# Patient Record
Sex: Female | Born: 1970 | Race: White | Hispanic: No | Marital: Married
Health system: Southern US, Community
[De-identification: ages and names within clinical notes are randomized; demographics above are authoritative.]

---

## 2005-07-26 ENCOUNTER — Ambulatory Visit: Payer: Self-pay | Admitting: Obstetrics and Gynecology

## 2005-07-28 ENCOUNTER — Ambulatory Visit: Payer: Self-pay | Admitting: Obstetrics and Gynecology

## 2006-01-30 ENCOUNTER — Ambulatory Visit: Payer: Self-pay | Admitting: Obstetrics and Gynecology

## 2007-09-07 IMAGING — US ULTRASOUND RIGHT BREAST
1 series · 16 of 16 positions shown · non-contrast
Comparison: none

REASON FOR EXAM: Density  right breast  Poss US
COMMENTS:

[Series 1: ultrasound right breast · 16 of 16 slices shown]
[im 1/16]
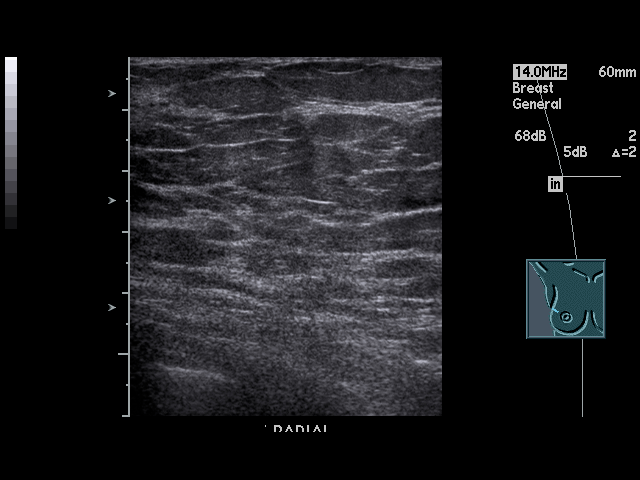
[im 2/16]
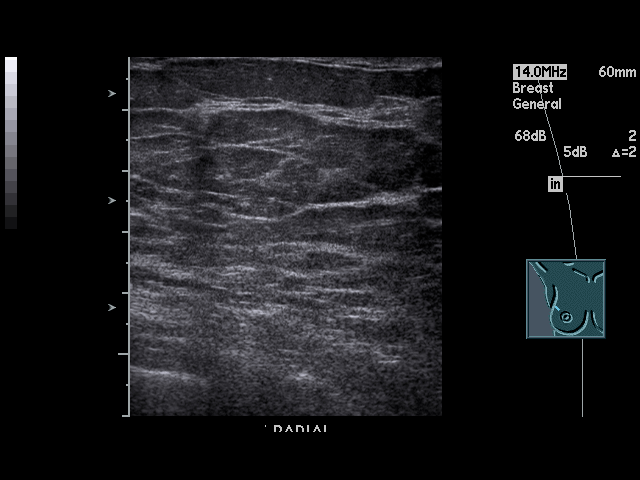
[im 3/16]
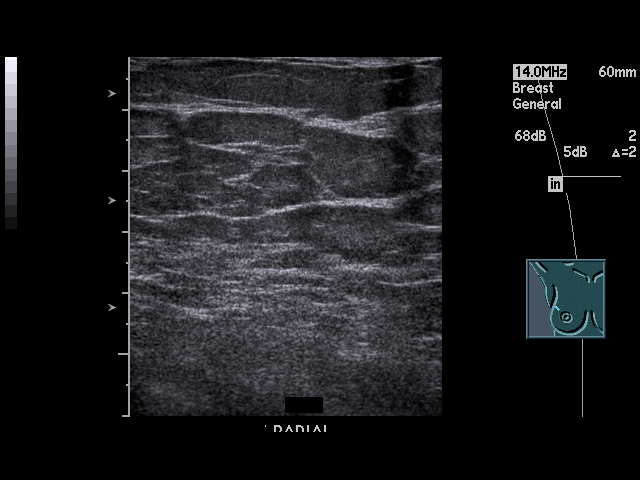
[im 4/16]
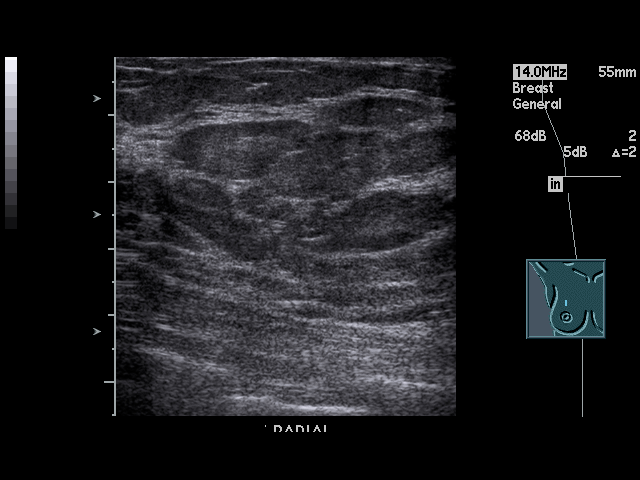
[im 5/16]
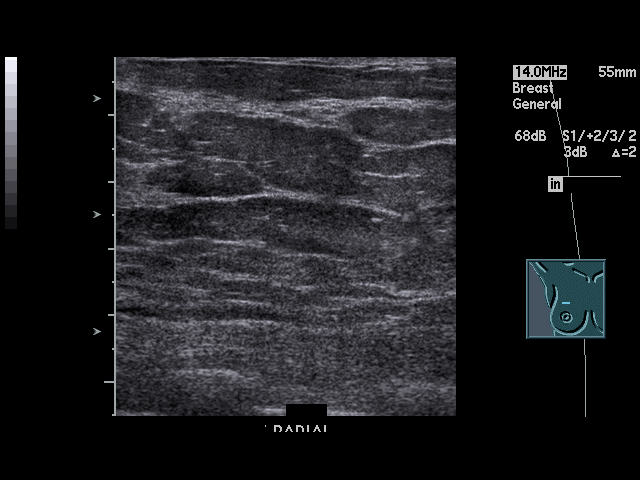
[im 6/16]
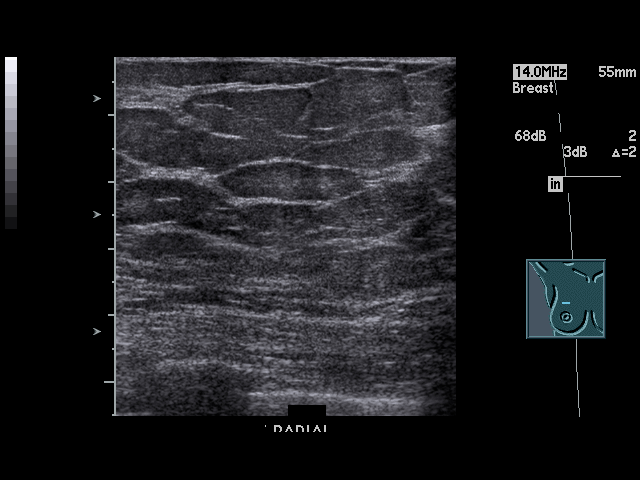
[im 7/16]
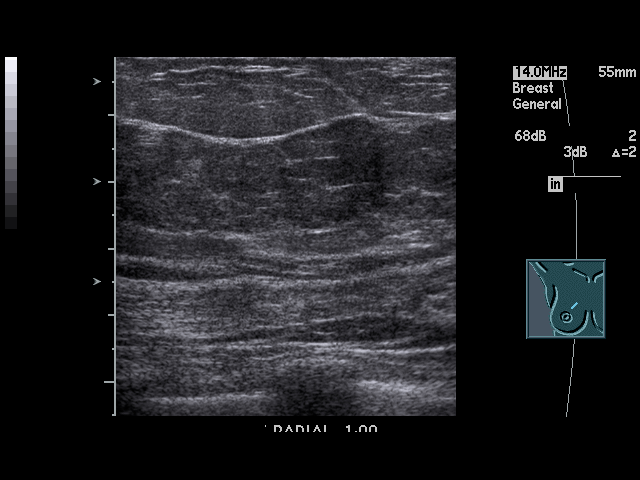
[im 8/16]
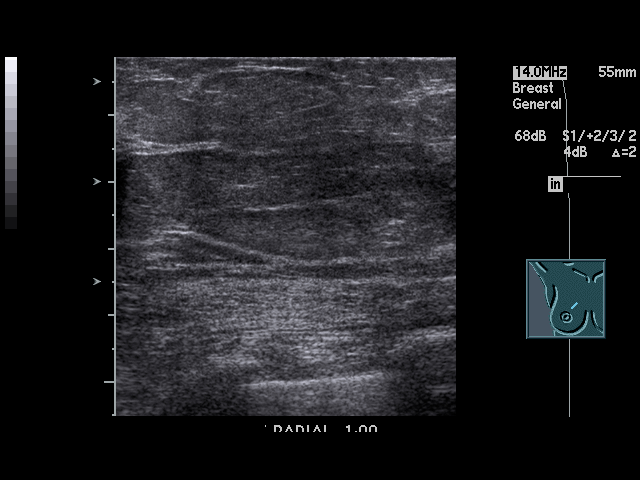
[im 9/16]
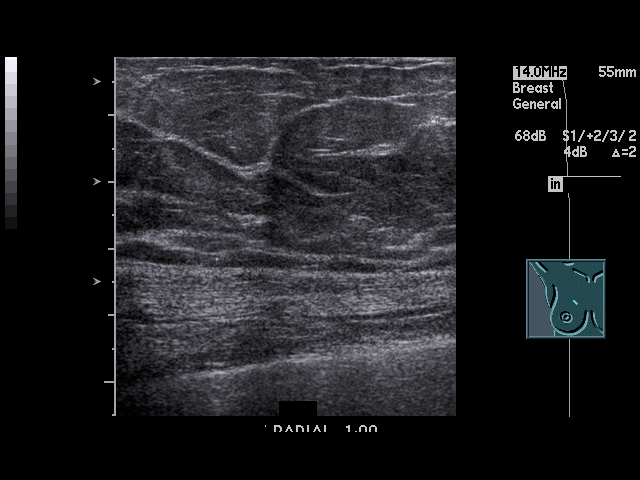
[im 10/16]
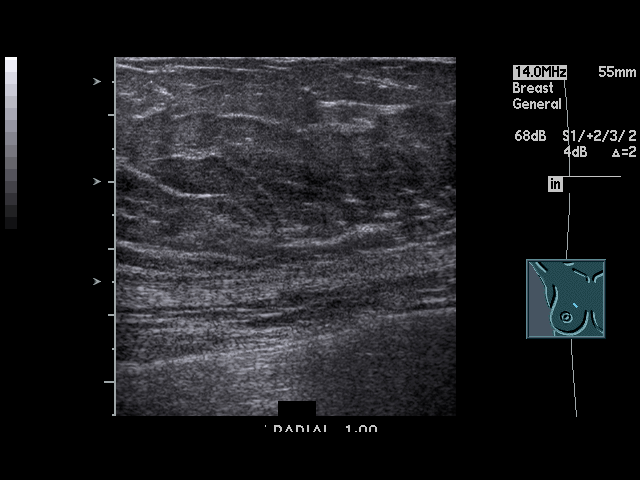
[im 11/16]
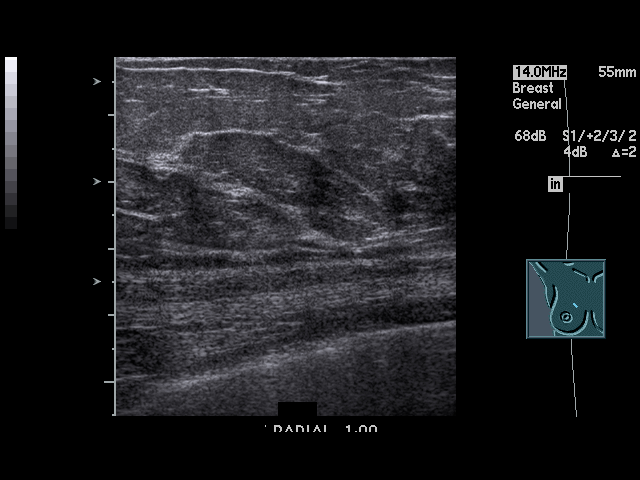
[im 12/16]
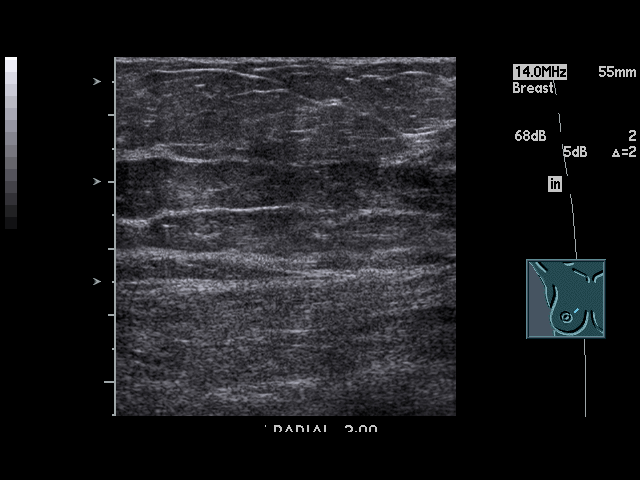
[im 13/16]
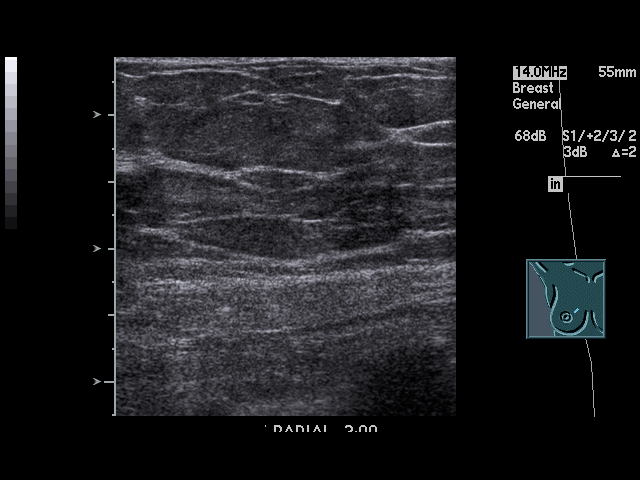
[im 14/16]
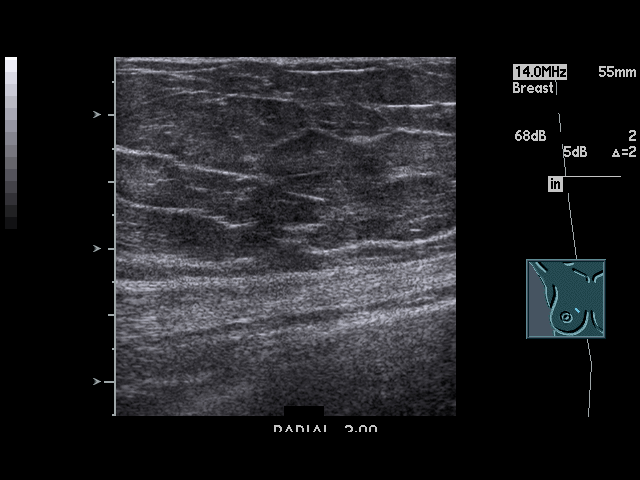
[im 15/16]
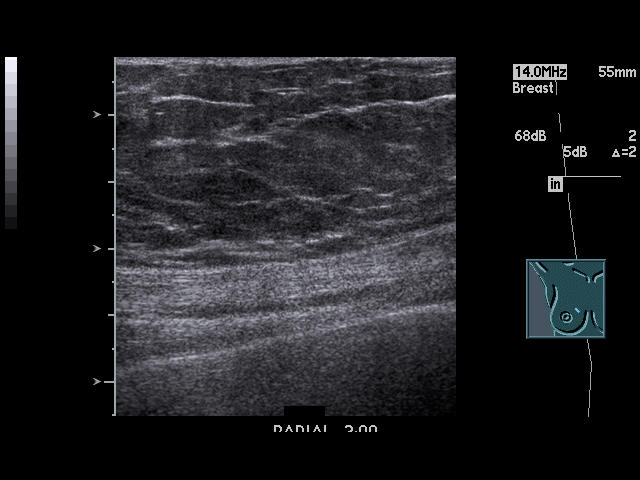
[im 16/16]
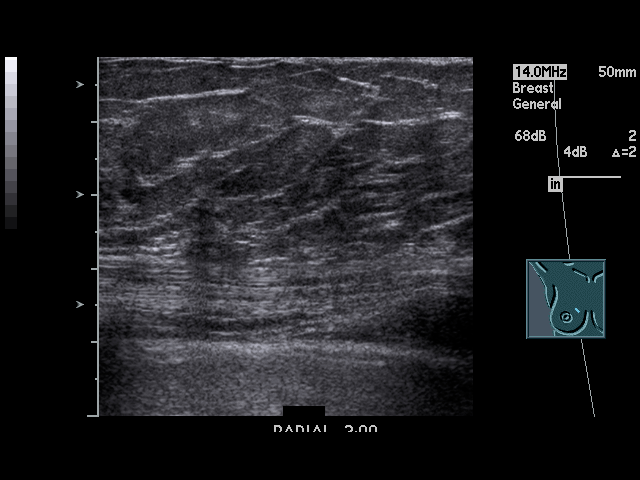

[16 of 16 positions shown; findings below may reference images not displayed]

PROCEDURE:     US  - US BREAST RIGHT  - July 28, 2005 [DATE]

RESULT:       Density was noted in the upper outer aspect of the RIGHT
breast on prior screening study.  Ultrasound and spot compression views were
requested.  Today's ultrasound reveals no discrete cystic or solid mass.
The echotexture of the imaged parenchyma was normal.  Please see the
dictation for the supplemental mammographic views of this same for final
recommendations and BI-RADS classification.
IMPRESSION: See above.

## 2009-08-20 ENCOUNTER — Ambulatory Visit: Payer: Self-pay | Admitting: Obstetrics and Gynecology

## 2010-10-05 ENCOUNTER — Ambulatory Visit: Payer: Self-pay | Admitting: Obstetrics and Gynecology

## 2010-10-07 ENCOUNTER — Ambulatory Visit: Payer: Self-pay | Admitting: Obstetrics and Gynecology

## 2011-04-19 ENCOUNTER — Ambulatory Visit: Payer: Self-pay | Admitting: Obstetrics and Gynecology

## 2011-04-19 LAB — URINALYSIS, COMPLETE
Bilirubin,UR: NEGATIVE
Glucose,UR: NEGATIVE mg/dL (ref 0–75)
Ketone: NEGATIVE
Leukocyte Esterase: NEGATIVE
Nitrite: NEGATIVE
Ph: 5 (ref 4.5–8.0)
Protein: NEGATIVE
Specific Gravity: 1.024 (ref 1.003–1.030)

## 2011-04-25 ENCOUNTER — Ambulatory Visit: Payer: Self-pay | Admitting: Obstetrics and Gynecology

## 2011-04-25 LAB — PREGNANCY, URINE: Pregnancy Test, Urine: NEGATIVE m[IU]/mL

## 2011-04-26 LAB — HEMATOCRIT: HCT: 30.8 % — ABNORMAL LOW (ref 35.0–47.0)

## 2011-10-06 ENCOUNTER — Ambulatory Visit: Payer: Self-pay | Admitting: Obstetrics and Gynecology

## 2012-10-08 ENCOUNTER — Ambulatory Visit: Payer: Self-pay | Admitting: Obstetrics and Gynecology

## 2014-06-08 NOTE — Op Note (Signed)
PATIENT NAME:  Meghan Khan, Kaneshia C MR#:  [id number] DATE OF BIRTH:  1970-12-18  DATE OF PROCEDURE:  04/25/2011  PREOPERATIVE DIAGNOSIS: Pelvic pain with endometriosis.   POSTOPERATIVE DIAGNOSIS: Pelvic pain with endometriosis.  PROCEDURE:  1. Laparoscopic supracervical hysterectomy. 2. Right salpingo-oophorectomy.  3. Appendectomy. 4. Ablation of endometriosis deposits.   SURGEON: Ricky L. Logan BoresEvans, MD   ASSISTANT: Marchelle FolksAmanda, Scrub Tech   ANESTHESIA: General endotracheal.   ANESTHESIOLOGIST:  Heriberto AntiguaScott Palmer, MD   ESTIMATED BLOOD LOSS: 30.   COMPLICATIONS: None.   SPECIMENS: None.   DRAINS: Foley.   PROCEDURE IN DETAIL: The patient received 1 gram of Ancef and was taken to the Operating Room and placed in the supine position, where anesthesia was initiated, and then placed in the dorsal lithotomy position using Allen stirrups, prepped and draped in the usual sterile fashion. The cervix was visualized. A single-tooth and sound were placed in the usual fashion and Steri-Stripped together, and a Foley catheter was placed.   An 11 port was placed infraumbilically and in bilateral lower quadrants under direct visualization after establishment of pneumoperitoneum. We proceeded with LSH in the usual fashion using a Harmonic scalpel down to the level of the uterine arteries, which were then cauterized with Kleppinger; and then a stump was created with a Harmonic scalpel. The sound was removed, and while the hand of the operator was in the vagina the endocervical canal was cauterized. Oozing at the left uterine artery was controlled with a Harmonic scalpel and light cautery while elevating the cervix.   We planned on doing a unilateral salpingo-oophorectomy with side being determined intraoperatively. The patient again stated this morning she has primarily pain on the right but that has lately been bilateral. On visualization both ovaries looked unremarkable; therefore, I elected to proceed with right  oophorectomy which was carried out without difficulty using a Harmonic scalpel and Kleppingers.   I visualized the appendix with some minimal scarring at the ascending colon which did not involve the body of the appendix, but given the history of right-sided pelvic pain and endometriosis I elected proceed. Appendectomy was carried out using preloaded clips, and it was delivered in an Endopouch to the left lower quadrant port. There was some minor oozing along the staple line which was controlled with a Harmonic scalpel and light Kleppinger.   A morcellator was then placed through the left lower quadrant port, and the uterus was removed in morcellated fashion. The ovary was removed intact. An 11 port was replaced. The pelvis was copiously irrigated. Pressure was lowered to 6 mmHg. All areas were seen to be hemostatic.   Again there were noted to be several deposits of endometriosis and blanched-looking scar tissue all throughout the cul-de-sac. Deposits of endometriosis were cauterized using harmonic scalpel, and some tight banding along the right uterosacral was released with harmonic scalpel.   All areas appeared hemostatic.  Pneumoperitoneum was allowed to resolve. Ports were removed. The incisions were closed with deep of 0, subcutaneous with 3-0 Vicryl. Steri-Strips, Band-Aids, and Dermabond were placed.   The  patient tolerated the procedure well. A Foley was left in place. She was then left in the care of Anesthesia. I anticipate a routine postoperative course.  ____________________________ Reatha Harpsicky L. Logan BoresEvans, MD rle:cbb D: 04/25/2011 09:01:14 ET T: 04/25/2011 10:13:03 ET JOB#: [id number] Jestine Bicknell L Taher Vannote MD ELECTRONICALLY SIGNED 04/27/2011 14:14
